# Patient Record
Sex: Male | Born: 1990 | Race: White | Hispanic: No | Marital: Single | Smoking: Never smoker
Health system: Southern US, Community
[De-identification: ages and names within clinical notes are randomized; demographics above are authoritative.]

## PROBLEM LIST (undated history)

## (undated) DIAGNOSIS — Z8619 Personal history of other infectious and parasitic diseases: Secondary | ICD-10-CM

## (undated) DIAGNOSIS — T7840XA Allergy, unspecified, initial encounter: Secondary | ICD-10-CM

## (undated) DIAGNOSIS — J45909 Unspecified asthma, uncomplicated: Secondary | ICD-10-CM

## (undated) HISTORY — DX: Allergy, unspecified, initial encounter: T78.40XA

## (undated) HISTORY — DX: Unspecified asthma, uncomplicated: J45.909

## (undated) HISTORY — DX: Personal history of other infectious and parasitic diseases: Z86.19

## (undated) HISTORY — PX: TONSILLECTOMY: SUR1361

---

## 2010-10-30 ENCOUNTER — Inpatient Hospital Stay (INDEPENDENT_AMBULATORY_CARE_PROVIDER_SITE_OTHER)
Admission: RE | Admit: 2010-10-30 | Discharge: 2010-10-30 | Disposition: A | Discharge status: Home / Self Care | Payer: Self-pay | Source: Ambulatory Visit | Attending: Emergency Medicine | Admitting: Emergency Medicine

## 2010-10-30 DIAGNOSIS — R6889 Other general symptoms and signs: Secondary | ICD-10-CM

## 2012-11-06 ENCOUNTER — Ambulatory Visit (INDEPENDENT_AMBULATORY_CARE_PROVIDER_SITE_OTHER): Payer: No Typology Code available for payment source | Admitting: Internal Medicine

## 2012-11-06 ENCOUNTER — Encounter: Payer: Self-pay | Admitting: Internal Medicine

## 2012-11-06 VITALS — BP 120/80 | HR 88 | Temp 98.6°F | Resp 20 | Ht 70.5 in | Wt 177.0 lb

## 2012-11-06 DIAGNOSIS — J45909 Unspecified asthma, uncomplicated: Secondary | ICD-10-CM

## 2012-11-06 DIAGNOSIS — Z0000 Encounter for general adult medical examination without abnormal findings: Secondary | ICD-10-CM

## 2012-11-06 NOTE — Progress Notes (Signed)
Subjective:    Patient ID: Edward Salazar, male    DOB: February 14, 1991, 22 y.o.   MRN: [id number]  HPI  22 year old patient who is seen today to establish with our practice. His chief complaint is some diminished exercise capacity. He states that he has a history of childhood asthma and believes that he may have been treated with albuterol when he was young. He states when he attempts to jog he gets short of breath after approximately 3/4 of a mile. He feels that his exercise capacity never has been very good. He is status with a another doctor recently and he states his spirometry showed some mild obstruction. He does have a history of allergic rhinitis worse in the fall in the spring. He denies any wheezing. He works as a Retail banker and is exposed to organic and toxic substances but he does wear filters when appropriate  Past medical history is otherwise unremarkable. Social history. 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 Both parents the remaining [place]. He was raised by an aunt and uncle. He was a smoker from age [age] but low-volume about one pack per week Single  Family history father is in his early 78s he choses health unknown mother unknown paternal grandparents had both coronary artery disease and strokes One brother health unknown    Review of Systems  Constitutional: Negative for fever, chills, activity change, appetite change and fatigue.  HENT: Negative for hearing loss, ear pain, congestion, rhinorrhea, sneezing, mouth sores, trouble swallowing, neck pain, neck stiffness, dental problem, voice change, sinus pressure and tinnitus.   Eyes: Negative for photophobia, pain, redness and visual disturbance.  Respiratory: Negative for apnea, cough, choking, chest tightness, shortness of breath and wheezing.   Cardiovascular: Negative for chest pain, palpitations and leg swelling.  Gastrointestinal: Negative for nausea, vomiting,  abdominal pain, diarrhea, constipation, blood in stool, abdominal distention, anal bleeding and rectal pain.  Genitourinary: Negative for dysuria, urgency, frequency, hematuria, flank pain, decreased urine volume, discharge, penile swelling, scrotal swelling, difficulty urinating, genital sores and testicular pain.  Musculoskeletal: Negative for myalgias, back pain, joint swelling, arthralgias and gait problem.  Skin: Negative for color change, rash and wound.  Neurological: Negative for dizziness, tremors, seizures, syncope, facial asymmetry, speech difficulty, weakness, light-headedness, numbness and headaches.  Hematological: Negative for adenopathy. Does not bruise/bleed easily.  Psychiatric/Behavioral: Negative for suicidal ideas, hallucinations, behavioral problems, confusion, sleep disturbance, self-injury, dysphoric mood, decreased concentration and agitation. The patient is not nervous/anxious.        Objective:   Physical Exam  Constitutional: He appears well-developed and well-nourished.  HENT:  Head: Normocephalic and atraumatic.  Right Ear: External ear normal.  Left Ear: External ear normal.  Nose: Nose normal.  Mouth/Throat: Oropharynx is clear and moist.  Eyes: Conjunctivae and EOM are normal. Pupils are equal, round, and reactive to light. No scleral icterus.  Neck: Normal range of motion. Neck supple. No JVD present. No thyromegaly present.  Cardiovascular: Regular rhythm, normal heart sounds and intact distal pulses.  Exam reveals no gallop and no friction rub.   No murmur heard. Pulmonary/Chest: Effort normal and breath sounds normal. No respiratory distress. He has no wheezes. He has no rales. He exhibits no tenderness.  O2 saturation 99  Abdominal: Soft. Bowel sounds are normal. He exhibits no distension and no mass. There is no tenderness.  Genitourinary: Prostate normal and penis normal.  Musculoskeletal: Normal range of motion. He exhibits no edema and  no  tenderness.  Lymphadenopathy:    He has no cervical adenopathy.  Neurological: He is alert. He has normal reflexes. No cranial nerve deficit. Coordination normal.  Skin: Skin is warm and dry. No rash noted.  Psychiatric: He has a normal mood and affect. His behavior is normal.          Assessment & Plan:  Preventive health exam History of exercise intolerance. Unclear whether this is exercise associated asthma (doubtful) or possibly just deconditioning. We'll attempt to obtain spirometry today. Give a trial of albuterol prior to exercise

## 2012-11-06 NOTE — Patient Instructions (Signed)
Albuterol 2 inhalations prior to exercise  Call or return to clinic prn if these symptoms worsen or fail to improve as anticipated.

## 2012-12-10 ENCOUNTER — Ambulatory Visit (INDEPENDENT_AMBULATORY_CARE_PROVIDER_SITE_OTHER): Payer: No Typology Code available for payment source | Admitting: Internal Medicine

## 2012-12-10 ENCOUNTER — Encounter: Payer: Self-pay | Admitting: Internal Medicine

## 2012-12-10 VITALS — BP 110/78 | HR 77 | Temp 98.0°F | Resp 20 | Wt 172.0 lb

## 2012-12-10 DIAGNOSIS — B078 Other viral warts: Secondary | ICD-10-CM

## 2012-12-10 DIAGNOSIS — J069 Acute upper respiratory infection, unspecified: Secondary | ICD-10-CM

## 2012-12-10 DIAGNOSIS — B079 Viral wart, unspecified: Secondary | ICD-10-CM

## 2012-12-10 NOTE — Progress Notes (Signed)
Subjective:    Patient ID: Edward Salazar, male    DOB: [birth year], 22 y.o.   MRN: [id number]  HPI  22 year old patient who presents a two-day history of sinus congestion sore throat and minimal cough. He also has mild headache. No fever. 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 He has tried albuterol with some benefit  He is also complaining of a wart just distal to his left knee and is requesting removal  Past Medical History  Diagnosis Date  . Asthma   . History of chicken pox   . Allergy     History   Social History  . Marital Status: Single    Spouse Name: N/A    Number of Children: N/A  . Years of Education: N/A   Occupational History  . Not on file.   Social History Main Topics  . Smoking status: Never Smoker   . Smokeless tobacco: Never Used  . Alcohol Use: 7.2 oz/week    12 Cans of beer per week  . Drug Use: No  . Sexual Activity: Not on file   Other Topics Concern  . Not on file   Social History Narrative  . No narrative on file    No past surgical history on file.  No family history on file.  No Known Allergies  Current Outpatient Prescriptions on File Prior to Visit  Medication Sig Dispense Refill  . Multiple Vitamin (MULTIVITAMIN) tablet Take 1 tablet by mouth daily.       No current facility-administered medications on file prior to visit.    BP 110/78  Pulse 77  Temp(Src) 98 F (36.7 C) (Oral)  Resp 20  Wt 172 lb (78.019 kg)  BMI 24.32 kg/m2  SpO2 97%       Review of Systems  Constitutional: Negative for fever, chills, appetite change and fatigue.  HENT: Positive for congestion, rhinorrhea and postnasal drip. Negative for hearing loss, ear pain, sore throat, trouble swallowing, neck stiffness, dental problem, voice change and tinnitus.   Eyes: Negative for pain, discharge and visual disturbance.  Respiratory: Positive for cough. Negative for chest tightness, wheezing and stridor.   Cardiovascular:  Negative for chest pain, palpitations and leg swelling.  Gastrointestinal: Negative for nausea, vomiting, abdominal pain, diarrhea, constipation, blood in stool and abdominal distention.  Genitourinary: Negative for urgency, hematuria, flank pain, discharge, difficulty urinating and genital sores.  Musculoskeletal: Negative for myalgias, back pain, joint swelling, arthralgias and gait problem.  Skin: Negative for rash.  Neurological: Positive for headaches. Negative for dizziness, syncope, speech difficulty, weakness and numbness.  Hematological: Negative for adenopathy. Does not bruise/bleed easily.  Psychiatric/Behavioral: Negative for behavioral problems and dysphoric mood. The patient is not nervous/anxious.        Objective:   Physical Exam  Constitutional: He is oriented to person, place, and time. He appears well-developed.  HENT:  Head: Normocephalic.  Right Ear: External ear normal.  Left Ear: External ear normal.  Mild erythema of the oropharynx  Eyes: Conjunctivae and EOM are normal.  Neck: Normal range of motion.  Cardiovascular: Normal rate and normal heart sounds.   Pulmonary/Chest: Breath sounds normal.  Abdominal: Bowel sounds are normal.  Musculoskeletal: Normal range of motion. He exhibits no edema and no tenderness.  Neurological: He is alert and oriented to person, place, and time.  Psychiatric: He has a normal mood and affect. His behavior is normal.          Assessment & Plan:  Lower URI. We'll  treat symptomatically  Verruca left knee. Procedure note:  The 5 mm verruca  involving the anterior surface of the left lower leg just distal to the knee was treated with cryotherapy without complication

## 2012-12-10 NOTE — Patient Instructions (Signed)
Acute sinusitis symptoms for less than 10 days are generally not helped by antibiotic therapy.  Use saline irrigation, warm  moist compresses and over-the-counter decongestants only as directed.  Call if there is no improvement in 5 to 7 days, or sooner if you develop increasing pain, fever, or any new symptoms. 

## 2013-10-18 ENCOUNTER — Encounter: Payer: Self-pay | Admitting: Internal Medicine

## 2013-10-18 ENCOUNTER — Ambulatory Visit (INDEPENDENT_AMBULATORY_CARE_PROVIDER_SITE_OTHER): Payer: No Typology Code available for payment source | Admitting: Internal Medicine

## 2013-10-18 VITALS — BP 138/90 | HR 83 | Temp 97.5°F | Resp 20 | Ht 70.5 in | Wt 175.0 lb

## 2013-10-18 DIAGNOSIS — D239 Other benign neoplasm of skin, unspecified: Secondary | ICD-10-CM

## 2013-10-18 DIAGNOSIS — D229 Melanocytic nevi, unspecified: Secondary | ICD-10-CM

## 2013-10-18 NOTE — Patient Instructions (Signed)
Call or return to clinic prn if these symptoms worsen or fail to improve as anticipated.

## 2013-10-18 NOTE — Progress Notes (Signed)
   Subjective:    Patient ID: Edward Salazar, male    DOB: [birth year], 23 y.o.   MRN: [id number]  HPI  23 year old patient who is in today concerned about a skin lesion involving his right facial area.  He also has some lesions on his posterior back area.  Past Medical History  Diagnosis Date  . Asthma   . History of chicken pox   . Allergy     History   Social History  . Marital Status: Single    Spouse Name: N/A    Number of Children: N/A  . Years of Education: N/A   Occupational History  . Not on file.   Social History Main Topics  . Smoking status: Never Smoker   . Smokeless tobacco: Never Used  . Alcohol Use: 7.2 oz/week    12 Cans of beer per week  . Drug Use: No  . Sexual Activity: Not on file   Other Topics Concern  . Not on file   Social History Narrative  . No narrative on file    History reviewed. No pertinent past surgical history.  No family history on file.  No Known Allergies  Current Outpatient Prescriptions on File Prior to Visit  Medication Sig Dispense Refill  . Multiple Vitamin (MULTIVITAMIN) tablet Take 1 tablet by mouth daily.       No current facility-administered medications on file prior to visit.    BP 138/90  Pulse 83  Temp(Src) 97.5 F (36.4 C) (Oral)  Resp 20  Ht 5' 10.5" (1.791 m)  Wt 175 lb (79.379 kg)  BMI 24.75 kg/m2  SpO2 98%      Review of Systems  Skin: Positive for rash.       Objective:   Physical Exam  Constitutional: He appears well-developed and well-nourished. No distress.  Repeat blood pressure improved to 130/ 72  Skin:   2 mm pigmented papular lesion, right facial area, consistent with a benign nevus  Scattered benign nevi of the back          Assessment & Plan:   Multiple benign nevi.  Patient reassured

## 2014-07-05 ENCOUNTER — Telehealth: Payer: Self-pay | Admitting: Internal Medicine

## 2014-07-05 NOTE — Telephone Encounter (Signed)
Pt's girlfriend was told she had pin worm infection. Would like to know if he should get rx as well. [facility] [address]

## 2014-07-05 NOTE — Telephone Encounter (Signed)
Spoke to pt, told him Dr. Raliegh Ip suggest the patient have his girlfriend talked to her physician for recommendations. Therapy may be indicated depending on the type of infection. Pt verbalized understanding and will get back to me.

## 2014-07-05 NOTE — Telephone Encounter (Signed)
Please see message and advise 

## 2014-07-05 NOTE — Telephone Encounter (Signed)
Suggest the patient have his girlfriend talked to her physician for recommendations.  Therapy may be indicated depending on the type of infection

## 2014-07-13 ENCOUNTER — Telehealth: Payer: Self-pay | Admitting: Internal Medicine

## 2014-07-13 MED ORDER — ALBENDAZOLE 200 MG PO TABS: ORAL_TABLET | ORAL | Status: DC

## 2014-07-13 NOTE — Telephone Encounter (Signed)
Left message on voicemail to call office.  

## 2014-07-13 NOTE — Telephone Encounter (Signed)
Please see message and advise if need to treat pt also for pin worms?

## 2014-07-13 NOTE — Telephone Encounter (Signed)
400 mg  #2  One now and repeat in 4 weeks

## 2014-07-13 NOTE — Telephone Encounter (Signed)
Spoke to pt, told him will send Rx for Albendazole 200 mg tablets, take 2 now and repeat in 4 weeks. Pt verbalized understanding. Rx sent to pharmacy.

## 2014-07-13 NOTE — Telephone Encounter (Signed)
Pt girlfriend is getting 2 part treatment of albensazole. This is follow up from telephone note from 07-05-14

## 2015-04-18 ENCOUNTER — Ambulatory Visit (INDEPENDENT_AMBULATORY_CARE_PROVIDER_SITE_OTHER): Payer: 59 | Admitting: Internal Medicine

## 2015-04-18 ENCOUNTER — Encounter: Payer: Self-pay | Admitting: Internal Medicine

## 2015-04-18 VITALS — BP 124/80 | HR 84 | Temp 98.4°F | Resp 20 | Ht 70.5 in | Wt 174.0 lb

## 2015-04-18 DIAGNOSIS — Z23 Encounter for immunization: Secondary | ICD-10-CM

## 2015-04-18 DIAGNOSIS — B079 Viral wart, unspecified: Secondary | ICD-10-CM

## 2015-04-18 DIAGNOSIS — B078 Other viral warts: Secondary | ICD-10-CM

## 2015-04-18 NOTE — Progress Notes (Signed)
   Subjective:    Patient ID: Edward Salazar, male    DOB: [birth year], 25 y.o.   MRN: [id number]  HPI  25 year old patient who complains of a recurrent warts involving his left knee area.  He states this was treated by cryotherapy approximately 2 years ago and more recently has recurred Otherwise, doing well  Social history.  Going to school part-time in Engineer, production.  Hopeful to be excepted to [employer] for an engineering degree.  This spring   Past Medical History  Diagnosis Date  . Asthma   . History of chicken pox   . Allergy      Review of Systems  Constitutional: Negative for fever, chills, appetite change and fatigue.  HENT: Negative for congestion, dental problem, ear pain, hearing loss, sore throat, tinnitus, trouble swallowing and voice change.   Eyes: Negative for pain, discharge and visual disturbance.  Respiratory: Negative for cough, chest tightness, wheezing and stridor.   Cardiovascular: Negative for chest pain, palpitations and leg swelling.  Gastrointestinal: Negative for nausea, vomiting, abdominal pain, diarrhea, constipation, blood in stool and abdominal distention.  Genitourinary: Negative for urgency, hematuria, flank pain, discharge, difficulty urinating and genital sores.  Musculoskeletal: Negative for myalgias, back pain, joint swelling, arthralgias, gait problem and neck stiffness.  Skin: Positive for rash.  Neurological: Negative for dizziness, syncope, speech difficulty, weakness, numbness and headaches.  Hematological: Negative for adenopathy. Does not bruise/bleed easily.  Psychiatric/Behavioral: Negative for behavioral problems and dysphoric mood. The patient is not nervous/anxious.        Objective:   Physical Exam  Constitutional: He appears well-developed and well-nourished.  Skin:  5 x 7 mm verruca left knee area          Assessment & Plan:   Verruca, left knee.  Treated with cryotherapy

## 2015-04-18 NOTE — Progress Notes (Signed)
Pre visit review using our clinic review tool, if applicable. No additional management support is needed unless otherwise documented below in the visit note. 

## 2015-04-18 NOTE — Patient Instructions (Signed)
Cryosurgery for Skin Conditions Cryosurgery, also called cryotherapy, is the use of extreme cold to freeze and remove abnormal or diseased tissue. Growths on the skin such as warts, precancerous skin lesions (actinic keratoses), and some kinds of skin cancer may be removed with cryosurgery. LET [facility] CARE PROVIDER KNOW ABOUT:  Any allergies you have.  All medicines you are taking, including vitamins, herbs, eye drops, creams, and over-the-counter medicines.  Previous problems you or members of your family have had with the use of anesthetics.  Any blood disorders you have.  Previous surgeries you have had.  Medical conditions you have. RISKS AND COMPLICATIONS Generally, this is a safe procedure. However, as with any procedure, complications can occur. Possible complications include:  Scars.  Changes in skin color (lighter or darker than normal skin tone).  Swelling.  Nerve damage and loss of feeling (rare). BEFORE THE PROCEDURE No preparation is necessary. PROCEDURE  Cryosurgery usually takes a few minutes and can be done in your health care provider's office. There are different methods for performing cryosurgery.   Your health care provider may use a device (probe) that has liquid nitrogen flowing through it. The liquid nitrogen cools the probe. The probe is then applied to the growth until it is frozen and destroyed.  Your health care provider may spray liquid nitrogen directly on the growth. AFTER THE PROCEDURE Shortly after the procedure, the treated area will become red and swollen. This is normal. You will be advised to keep the treated area clean and covered with a bandage until healed. You will be able to go home shortly after the procedure. You may need the treatment again if the growth comes back.   This information is not intended to replace advice given to you by your health care provider. Make sure you discuss any questions you have with your health care  provider.   Document Released: 03/29/2000 Document Revised: 12/02/2012 Document Reviewed: 10/30/2012 Elsevier Interactive Patient Education 2016 [place] Warts Warts are small growths on the skin. They are common and can occur on various areas of the body. A person may have one wart or multiple warts. Most warts are not painful, and they usually do not cause problems. However, warts can cause pain if they are large or occur in an area of the body where pressure will be applied to them, such as the bottom of the foot. In many cases, warts do not require treatment. They usually go away on their own over a period of many months to a couple years. Various treatments may be done for warts that cause problems or do not go away. Sometimes, warts go away and then come back again. CAUSES Warts are caused by a type of virus that is called human papillomavirus (HPV). This virus can spread from person to person through direct contact. Warts can also spread to other areas of the body when a person scratches a wart and then scratches another area of his or her body.  RISK FACTORS Warts are more likely to develop in:  People who are 17-73 years of age.  People who have a weakened body defense system (immune system). SYMPTOMS A wart may be round or oval or have an irregular shape. Most warts have a rough surface. Warts may range in color from skin color to light yellow, brown, or gray. They are generally less than  inch (1.3 cm) in size. Most warts are painless, but some can be painful when pressure is applied to them.  DIAGNOSIS A wart can usually be diagnosed from its appearance. In some cases, a tissue sample may be removed (biopsy) to be looked at under a microscope. TREATMENT In many cases, warts do not need treatment. If treatment is needed, options may include:  Applying medicated solutions, creams, or patches to the wart. These may be over-the-counter or prescription medicines that make the skin  soft so that layers will gradually shed away. In many cases, the medicine is applied one or two times per day and covered with a bandage.  Putting duct tape over the top of the wart (occlusion). You will leave the tape in place for as long as told by your health care provider, then you will replace it with a new strip of tape. This is done until the wart goes away.  Freezing the wart with liquid nitrogen (cryotherapy).  Burning the wart with:  Laser treatment.  An electrified probe (electrocautery).  Injection of a medicine (Candida antigen) into the wart to help the body's immune system to fight off the wart.  Surgery to remove the wart. HOME CARE INSTRUCTIONS  Apply over-the-counter and prescription medicines only as told by your health care provider.  Do not apply over-the-counter wart medicines to your face or genitals before you ask your health care provider if it is okay to do so.  Do not scratch or pick at a wart.  Wash your hands after you touch a wart.  Avoid shaving hair that is over a wart.  Keep all follow-up visits as told by your health care provider. This is important. SEEK MEDICAL CARE IF:  Your warts do not improve after treatment.  You have redness, swelling, or pain at the site of a wart.  You have bleeding from a wart that does not stop with light pressure.  You have diabetes and you develop a wart.   This information is not intended to replace advice given to you by your health care provider. Make sure you discuss any questions you have with your health care provider.   Document Released: 01/09/2005 Document Revised: 12/21/2014 Document Reviewed: 06/27/2014 Elsevier Interactive Patient Education [employer].

## 2015-06-21 ENCOUNTER — Encounter: Payer: Self-pay | Admitting: Internal Medicine

## 2015-06-21 ENCOUNTER — Ambulatory Visit (INDEPENDENT_AMBULATORY_CARE_PROVIDER_SITE_OTHER): Payer: 59 | Admitting: Internal Medicine

## 2015-06-21 VITALS — BP 120/70 | HR 72 | Temp 98.6°F | Resp 18 | Ht 70.5 in | Wt 169.0 lb

## 2015-06-21 DIAGNOSIS — B079 Viral wart, unspecified: Secondary | ICD-10-CM | POA: Diagnosis not present

## 2015-06-21 NOTE — Progress Notes (Signed)
Pre visit review using our clinic review tool, if applicable. No additional management support is needed unless otherwise documented below in the visit note. 

## 2015-06-21 NOTE — Patient Instructions (Signed)
Call or return to clinic prn if these symptoms worsen or fail to improve as anticipated. Cryotherapy Cryotherapy is when you put ice on your injury. Ice helps lessen pain and puffiness (swelling) after an injury. Ice works the best when you start using it in the first 24 to 48 hours after an injury. HOME CARE  Put a dry or damp towel between the ice pack and your skin.  You may press gently on the ice pack.  Leave the ice on for no more than 10 to 20 minutes at a time.  Check your skin after 5 minutes to make sure your skin is okay.  Rest at least 20 minutes between ice pack uses.  Stop using ice when your skin loses feeling (numbness).  Do not use ice on someone who cannot tell you when it hurts. This includes small children and people with memory problems (dementia). GET HELP RIGHT AWAY IF:  You have white spots on your skin.  Your skin turns blue or pale.  Your skin feels waxy or hard.  Your puffiness gets worse. MAKE SURE YOU:   Understand these instructions.  Will watch your condition.  Will get help right away if you are not doing well or get worse.   This information is not intended to replace advice given to you by your health care provider. Make sure you discuss any questions you have with your health care provider.   Document Released: 09/18/2007 Document Revised: 06/24/2011 Document Reviewed: 11/22/2010 Elsevier Interactive Patient Education [employer].

## 2015-06-21 NOTE — Progress Notes (Signed)
   Subjective:    Patient ID: Edward Salazar, male    DOB: [birth year], 25 y.o.   MRN: [id number]  HPI  25 year old patient who is seen today for cryosurgery to remove a wart.  He has had a wart involving the anterior left lower leg just distal to the patella.  This has been frozen past, but was much larger at that time it has reoccurred, but is much smaller.  He also has 2 smaller warts involving the right hand  Review of Systems  Skin: Positive for wound.       Objective:   Physical Exam  Skin:  The largest wart was involving his anterior left lower leg and was about 5 mm in diameter.  He had 2 smaller warts about 2 mm involving the right hand          Assessment & Plan:   Warts.  Treated with cryotherapy Patient tolerated procedure well Local wound care discussed

## 2015-07-31 ENCOUNTER — Telehealth: Payer: Self-pay | Admitting: Internal Medicine

## 2015-07-31 DIAGNOSIS — Z1159 Encounter for screening for other viral diseases: Secondary | ICD-10-CM

## 2015-07-31 NOTE — Telephone Encounter (Signed)
Okay to order MMR Titer?

## 2015-07-31 NOTE — Telephone Encounter (Signed)
Edward Salazar, okay to schedule pt for lab appt. I put order in EPIC.

## 2015-07-31 NOTE — Telephone Encounter (Signed)
Pt is going to [employer] in the fall and his immunization record was lost when he moved here from [place]. Pt needs mmr done 2 times, so would like an order for a mmr titer, so he could possibly only get the vaccination once. Ok to order mmr titer?

## 2015-07-31 NOTE — Telephone Encounter (Signed)
Left message to call back and schedule.

## 2015-07-31 NOTE — Telephone Encounter (Signed)
ok 

## 2015-08-02 ENCOUNTER — Other Ambulatory Visit (INDEPENDENT_AMBULATORY_CARE_PROVIDER_SITE_OTHER): Payer: 59

## 2015-08-02 ENCOUNTER — Telehealth: Payer: Self-pay | Admitting: Internal Medicine

## 2015-08-02 DIAGNOSIS — Z1159 Encounter for screening for other viral diseases: Secondary | ICD-10-CM | POA: Diagnosis not present

## 2015-08-02 NOTE — Telephone Encounter (Signed)
Patient is getting labs to show he has had his immunizations for MMR for college.  He has an imunization card without his name on it because he was adopted to the [place] from [place].  He wants to know if the lab work shows he has had the MMR immunizations if he can have Dr Burnice Logan look at his card to confirm that those immunizations were done and get something with his name on it.  He wants something sufficient to give to [facility]. He has his adoption records as well.  I have placed the copies of what the patient has in Dr. Truddie Hidden folder.

## 2015-08-03 LAB — MEASLES/MUMPS/RUBELLA IMMUNITY
Mumps IgG: 115 AU/mL — ABNORMAL HIGH (ref <9.00)
RUBEOLA IGG: 11.3 [AU]/ml (ref <25.00)
Rubella: 4.28 Index — ABNORMAL HIGH (ref <0.90)

## 2015-08-04 NOTE — Telephone Encounter (Signed)
Pt hs been scheduled

## 2015-08-07 NOTE — Telephone Encounter (Signed)
See result note. Paperwork has been shredded. Pt does not need filled out.

## 2015-08-22 ENCOUNTER — Telehealth: Payer: Self-pay | Admitting: Internal Medicine

## 2015-08-22 NOTE — Telephone Encounter (Signed)
FYI:  Pt coming 08/23/15 to see Dr. Raliegh Ip

## 2015-08-22 NOTE — Telephone Encounter (Signed)
[facility]  Patient Name: Edward Salazar  DOB: 12/13/1990    Initial Comment Caller States pain in lower abdominal, not eating, irregular bowel movements kind of yellowish in color, been going on since last thursday, last 7 months this has been going on and off   Nurse Assessment  Nurse: Wynetta Emery, RN, Baker Janus Date/Time Eilene Ghazi Time): 08/22/2015 9:52:23 AM  Confirm and document reason for call. If symptomatic, describe symptoms. You must click the next button to save text entered. ---[name] is having lower abd pain with stools yellow in color onset Thursday  Has the patient traveled out of the country within the last 30 days? ---No  Does the patient have any new or worsening symptoms? ---Yes  Will a triage be completed? ---Yes  Related visit to physician within the last 2 weeks? ---No  Does the PT have any chronic conditions? (i.e. diabetes, asthma, etc.) ---No  Is this a behavioral health or substance abuse call? ---No     Guidelines    Guideline Title Affirmed Question Affirmed Notes  Abdominal Pain - Male [1] MODERATE pain (e.g., interferes with normal activities) AND [2] pain comes and goes (cramps) AND [3] present > 24 hours (Exception: pain with Vomiting or Diarrhea - see that Guideline)    Final Disposition User   See Physician within 24 Hours Wynetta Emery, RN, Baker Janus    Comments  NOTE: appt given for Mikeal Hawthorne 08/23/2015 at 1015am c/o lower abd pain irreg bowel patterns - no appts avail today to fit Jaysun's schedule   Referrals  REFERRED TO PCP OFFICE   Disagree/Comply: Comply

## 2015-08-23 ENCOUNTER — Ambulatory Visit (INDEPENDENT_AMBULATORY_CARE_PROVIDER_SITE_OTHER): Payer: 59 | Admitting: Internal Medicine

## 2015-08-23 ENCOUNTER — Encounter: Payer: Self-pay | Admitting: Internal Medicine

## 2015-08-23 VITALS — BP 120/80 | HR 71 | Temp 98.2°F | Resp 20 | Ht 70.5 in | Wt 169.0 lb

## 2015-08-23 DIAGNOSIS — R1013 Epigastric pain: Secondary | ICD-10-CM | POA: Diagnosis not present

## 2015-08-23 NOTE — Progress Notes (Signed)
Pre visit review using our clinic review tool, if applicable. No additional management support is needed unless otherwise documented below in the visit note. 

## 2015-08-23 NOTE — Progress Notes (Signed)
   Subjective:    Patient ID: Edward Salazar, male    DOB: 1990-09-28, 25 y.o.   MRN: [id number]  HPI  25 year old patient who presents with a 6 month history of some GI complaints.  He complains of crampy abdominal pain and also some intermittent diarrhea. Clearly, dairy products, such as milk shakes are not well tolerated with crampy abdominal pain and diarrhea.  He also feels that stress at times may be playing a role. He frequently has early morning bowel movements that are associated with urgency and sometimes loose. No nausea, vomiting or change in his weight. He has been trying Pepto-Bismol and probiotics.  At times he resorts to crackers and applesauce and avoids a more balanced diet.  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  HENT: Negative for congestion, dental problem, ear pain, hearing loss, sore throat, tinnitus, trouble swallowing and voice change.   Eyes: Negative for pain, discharge and visual disturbance.  Respiratory: Negative for cough, chest tightness, wheezing and stridor.   Cardiovascular: Negative for chest pain, palpitations and leg swelling.  Gastrointestinal: Positive for abdominal pain and diarrhea. Negative for nausea, vomiting, constipation, blood in stool and abdominal distention.  Genitourinary: Negative for urgency, hematuria, flank pain, discharge, difficulty urinating and genital sores.  Musculoskeletal: Negative for myalgias, back pain, joint swelling, arthralgias, gait problem and neck stiffness.  Skin: Negative for rash.  Neurological: Negative for dizziness, syncope, speech difficulty, weakness, numbness and headaches.  Hematological: Negative for adenopathy. Does not bruise/bleed easily.  Psychiatric/Behavioral: Negative for behavioral problems and dysphoric mood. The patient is not  nervous/anxious.        Objective:   Physical Exam  Constitutional: He is oriented to person, place, and time. He appears well-developed.  HENT:  Head: Normocephalic.  Right Ear: External ear normal.  Left Ear: External ear normal.  Eyes: Conjunctivae and EOM are normal.  Neck: Normal range of motion.  Cardiovascular: Normal rate and normal heart sounds.   Pulmonary/Chest: Breath sounds normal.  Abdominal: Soft. Bowel sounds are normal. He exhibits no distension. There is no tenderness. There is no rebound and no guarding.  Musculoskeletal: Normal range of motion. He exhibits no edema or tenderness.  Neurological: He is alert and oriented to person, place, and time.  Psychiatric: He has a normal mood and affect. His behavior is normal.          Assessment & Plan:   Intermittent abdominal pain and diarrhea Probable lactose intolerance Possible component of IBS  Will treat with a high-fiber diet and avoid lactose.  Information dispensed We'll consider further evaluation if symptoms fail to improve

## 2015-08-23 NOTE — Patient Instructions (Signed)
Diet for Lactose Intolerance, Adult  Lactose intolerance is when the body is not able to digest lactose, a natural sugar found in milk and milk products. If you are lactose intolerant, you should avoid consuming food and drinks with lactose.   WHAT DO I NEED TO KNOW ABOUT THIS DIET?  · Avoid consuming foods and beverages with lactose.  · Look for the words "lactose-free" or "lactose-reduced" on food labels. You can have lactose-free foods and may be able to have small amounts of lactose-reduced foods.  · Make sure you get enough nutrients in your diet. People on this diet sometimes have trouble getting enough calcium, riboflavin, and vitamin D. Take supplements if directed by your health care provider. Talk to your health care provider about supplements if you are not taking any.  WHICH FOODS HAVE LACTOSE?  Lactose is found in milk and milk products, such as:   · Yogurt.    · Cheese.  · Butter.    · Margarine.  · Sour cream.    · Creamer.    · Whipped toppings and nondairy creamers.  · Ice cream and other milk-based desserts.  Lactose is also found in foods made with milk or milk ingredients. To find out whether a food is made with milk or a milk ingredient, look at the ingredients list. Avoid foods with the statement "May contain milk" and foods that contain:   · Butter.    · Cream.   · Milk.  · Milk solids.  · Milk powder.     · Whey.  · Curd.   · Caseinate.  · Lactose.  WHAT ARE SOME ALTERNATIVES TO MILK AND FOODS MADE WITH MILK PRODUCTS?  · Lactose-free products, such as lactose-free milk.  · Almond or rice milk.  · Soy products, such as soy yogurt, soy cheese, soy ice cream, soy-based sour cream, and soy-based infant formula.  · Nondairy products, such as nondairy creamers and nondairy whipped topping. Note that nondairy products sometimes contain lactose, so it is important to check the ingredients list.  CAN I HAVE ANY FOODS WITH LACTOSE?  Some people with lactose intolerance can safely eat foods that have a  little lactose. Foods with a little lactose have less than 1 g of lactose per serving. Examples of foods with a little lactose are:   · Aged cheese (such as Swiss, cheddar, or Parmesan cheese). One serving is about 1-2 oz.  · Cream cheese. One serving is about 2 Tbsp.  · Ricotta cheese. One serving is about ½ cup.  If you decide to try a food that has lactose:   · Eat only one food with lactose in it at a time.  · Eat only a small amount of the food.  · Stop eating the food if your symptoms return.  Some dairy products that are more likely than others to be tolerated include:  · Cheese, especially if it is aged.  · Cultured dairy products, such as yogurt, buttermilk, cottage cheese, and kefir. The healthy bacteria in these products help digest lactose.  · Lactose-hydrolyzed milk. This product contains 40-90% less lactose than milk.  AM I GETTING ENOUGH CALCIUM?  Calcium is found in many foods with lactose and is important for bone health. The amount of calcium you need depends on your age:   · Adults younger than 50 years need 1000 mg of calcium a day.  · Adults older than 50 years need 1200 mg of calcium a day.  Make sure you get enough calcium by taking a calcium supplement   1 cup (300-400 mg).  Canned salmon with edible bones, 3 oz (180 mg).  Collard greens, cooked,  cup (125 mg).  Edamame, cooked,  cup (125 mg).  Kale, frozen or cooked,  cup (90 mg).  Orange juice with calcium added, 1 cup (300-350 mg).  Sardines with edible bones, 3 oz (325 mg).  Spinach, cooked,  cup (145 mg).  Tofu set with calcium sulfate,  cup (250 mg).   This information is not intended to replace advice given to you by your health care  provider. Make sure you discuss any questions you have with your health care provider.   Document Released: 10/27/2013 Document Reviewed: 10/27/2013 Elsevier Interactive Patient Education 2016 Elsevier Inc. Lactose Intolerance, Adult Lactose is the natural sugar found in milk and milk products, such as cheese and yogurt. Lactose is digested by lactase, an enzyme in your small intestine. Some people do not produce enough lactase to digest lactose. This is called lactose intolerance. Lactose intolerance is different from milk allergy, which is a more serious reaction to the protein in milk.  CAUSES Causes of lactose intolerance may include:   Normal aging. The ability to produce lactase may decline with age, causing lactose intolerance over time.  Being born without the ability to make lactase.   Digestive diseases such as gastroenteritis or inflammatory bowel disease.  Surgery or injuries to your small intestine.  Infection in your intestines.  Certain antibiotic medicines and cancer treatments. SIGNS AND SYMPTOMS  Lactose intolerance can cause uncomfortable symptoms. These are likely to occur within 30 minutes to 2 hours after eating or drinking foods containing lactose. Symptoms of lactose intolerance may include:  Nausea.  Diarrhea.  Abdominal cramps or pain.  Bloating.   Gas.  DIAGNOSIS  There are several tests your health care provider can do to diagnose lactose intolerance. These tests include a hydrogen breath test and stool acidity test.  TREATMENT  No treatment can improve your body's ability to produce lactase. However, your symptoms can be controlled by limiting or avoiding milk products and other sources of lactose and adjusting your diet. Lactose-free milk is often tolerated. Lactose digestion may also be improved by adding lactase drops to regular milk or by taking lactase tablets when dairy products are consumed. Tolerance to lactose is individual. Some people  may be able to eat or drink small amounts of products with lactose, while other may need to avoid lactose entirely. Talk to your health care provider about what is best for you.  HOME CARE INSTRUCTIONS  Limit or avoidfoods, beverages, and medicines containing lactose as directed by your health care provider.   Read food and medicine labels carefully to avoid products containing lactose, milk solids, casein, or whey.  If you eliminate dairy products, replace the protein, calcium, vitamin D, and other nutrients they contain through other foods. A registered dietitian or your health care provider can help you adjust your diet.  Choose a milk substitute that is fortified with calcium and vitamin D. Be aware that soy milk contains high quality protein, while milks made from nuts or grains contain very little protein.  Use lactase drops or tablets if directed by your health care provider. SEEK MEDICAL CARE IF: You have no relief from your symptoms after eliminating milk products and other sources of lactose.    This information is not intended to replace advice given to you by your health care provider. Make sure you discuss any questions you have with your health care provider.  Document Released: 04/01/2005 Document Revised: 04/22/2014 Document Reviewed: 07/02/2013 Elsevier Interactive Patient Education 2016 Elsevier Inc. Irritable Bowel Syndrome, Adult Irritable bowel syndrome (IBS) is not one specific disease. It is a group of symptoms that affects the organs responsible for digestion (gastrointestinal or GI tract).  To regulate how your GI tract works, your body sends signals back and forth between your intestines and your brain. If you have IBS, there may be a problem with these signals. As a result, your GI tract does not function normally. Your intestines may become more sensitive and overreact to certain things. This is especially true when you eat certain foods or when you are under  stress.  There are four types of IBS. These may be determined based on the consistency of your stool:   IBS with diarrhea.   IBS with constipation.   Mixed IBS.   Unsubtyped IBS.  It is important to know which type of IBS you have. Some treatments are more likely to be helpful for certain types of IBS.  CAUSES  The exact cause of IBS is not known. RISK FACTORS You may have a higher risk of IBS if:  You are a woman.  You are younger than 27 years old.  You have a family history of IBS.  You have mental health problems.  You have had bacterial infection of your GI tract. SIGNS AND SYMPTOMS  Symptoms of IBS vary from person to person. The main symptom is abdominal pain or discomfort. Additional symptoms usually include one or more of the following:   Diarrhea, constipation, or both.   Abdominal swelling or bloating.   Feeling full or sick after eating a small or regular-size meal.   Frequent gas.   Mucus in the stool.   A feeling of having more stool left after a bowel movement.  Symptoms tend to come and go. They may be associated with stress, psychiatric conditions, or nothing at all.  DIAGNOSIS  There is no specific test to diagnose IBS. Your health care provider will make a diagnosis based on a physical exam, medical history, and your symptoms. You may have other tests to rule out other conditions that may be causing your symptoms. These may include:   Blood tests.   X-rays.   CT scan.  Endoscopy and colonoscopy. This is a test in which your GI tract is viewed with a long, thin, flexible tube. TREATMENT There is no cure for IBS, but treatment can help relieve symptoms. IBS treatment often includes:   Changes to your diet, such as:  Eating more fiber.  Avoiding foods that cause symptoms.  Drinking more water.  Eating regular, medium-sized portioned meals.  Medicines. These may include:  Fiber supplements if you have  constipation.  Medicine to control diarrhea (antidiarrheal medicines).  Medicine to help control muscle spasms in your GI tract (antispasmodic medicines).  Medicines to help with any mental health issues, such as antidepressants or tranquilizers.  Therapy.  Talk therapy may help with anxiety, depression, or other mental health issues that can make IBS symptoms worse.  Stress reduction.  Managing your stress can help keep symptoms under control. HOME CARE INSTRUCTIONS   Take medicines only as directed by your health care provider.  Eat a healthy diet.  Avoid foods and drinks with added sugar.  Include more whole grains, fruits, and vegetables gradually into your diet. This may be especially helpful if you have IBS with constipation.  Avoid any foods and drinks that make your symptoms  worse. These may include dairy products and caffeinated or carbonated drinks.  Do not eat large meals.  Drink enough fluid to keep your urine clear or pale yellow.  Exercise regularly. Ask your health care provider for recommendations of good activities for you.  Keep all follow-up visits as directed by your health care provider. This is important. SEEK MEDICAL CARE IF:   You have constant pain.  You have trouble or pain with swallowing.  You have worsening diarrhea. SEEK IMMEDIATE MEDICAL CARE IF:   You have severe and worsening abdominal pain.   You have diarrhea and:   You have a rash, stiff neck, or severe headache.   You are irritable, sleepy, or difficult to awaken.   You are weak, dizzy, or extremely thirsty.   You have bright red blood in your stool or you have black tarry stools.   You have unusual abdominal swelling that is painful.   You vomit continuously.   You vomit blood (hematemesis).   You have both abdominal pain and a fever.    This information is not intended to replace advice given to you by your health care provider. Make sure you discuss any  questions you have with your health care provider.   Document Released: 04/01/2005 Document Revised: 04/22/2014 Document Reviewed: 12/17/2013 Elsevier Interactive Patient Education [employer].

## 2015-09-07 ENCOUNTER — Telehealth: Payer: Self-pay | Admitting: Internal Medicine

## 2015-09-07 DIAGNOSIS — R1013 Epigastric pain: Secondary | ICD-10-CM

## 2015-09-07 NOTE — Telephone Encounter (Signed)
Pt still having abd pain and is asking what else can be done. Not eating any fatty food. Would like a call back concerning what to do next. Having some fatigue .

## 2015-09-07 NOTE — Telephone Encounter (Signed)
Please see message and advise 

## 2015-09-08 ENCOUNTER — Encounter: Payer: Self-pay | Admitting: Gastroenterology

## 2015-09-08 NOTE — Telephone Encounter (Signed)
Spoke to pt, told him Dr.K wants him to see GI, order put in and someone will contact you to schedule an appt. Pt verbalized understanding.

## 2015-09-08 NOTE — Telephone Encounter (Signed)
Please schedule GI consultation

## 2015-09-29 ENCOUNTER — Other Ambulatory Visit (INDEPENDENT_AMBULATORY_CARE_PROVIDER_SITE_OTHER): Payer: 59

## 2015-09-29 ENCOUNTER — Ambulatory Visit (INDEPENDENT_AMBULATORY_CARE_PROVIDER_SITE_OTHER): Payer: 59 | Admitting: Gastroenterology

## 2015-09-29 ENCOUNTER — Encounter: Payer: Self-pay | Admitting: Gastroenterology

## 2015-09-29 VITALS — BP 126/72 | HR 81 | Ht 71.0 in | Wt 160.0 lb

## 2015-09-29 DIAGNOSIS — R195 Other fecal abnormalities: Secondary | ICD-10-CM

## 2015-09-29 DIAGNOSIS — R1084 Generalized abdominal pain: Secondary | ICD-10-CM

## 2015-09-29 LAB — CBC WITH DIFFERENTIAL/PLATELET
BASOS ABS: 0 10*3/uL (ref 0.0–0.1)
BASOS PCT: 1.1 % (ref 0.0–3.0)
EOS ABS: 0.1 10*3/uL (ref 0.0–0.7)
Eosinophils Relative: 3.1 % (ref 0.0–5.0)
HCT: 43.9 % (ref 39.0–52.0)
Hemoglobin: 15.2 g/dL (ref 13.0–17.0)
Lymphocytes Relative: 37.8 % (ref 12.0–46.0)
Lymphs Abs: 1.5 10*3/uL (ref 0.7–4.0)
MCHC: 34.6 g/dL (ref 30.0–36.0)
MCV: 86.5 fl (ref 78.0–100.0)
MONO ABS: 0.4 10*3/uL (ref 0.1–1.0)
Monocytes Relative: 9.5 % (ref 3.0–12.0)
NEUTROS ABS: 2 10*3/uL (ref 1.4–7.7)
Neutrophils Relative %: 48.5 % (ref 43.0–77.0)
Platelets: 241 10*3/uL (ref 150.0–400.0)
RBC: 5.08 Mil/uL (ref 4.22–5.81)
RDW: 13.1 % (ref 11.5–15.5)
WBC: 4 10*3/uL (ref 4.0–10.5)

## 2015-09-29 LAB — COMPREHENSIVE METABOLIC PANEL
ALBUMIN: 5 g/dL (ref 3.5–5.2)
ALT: 15 U/L (ref 0–53)
AST: 15 U/L (ref 0–37)
Alkaline Phosphatase: 65 U/L (ref 39–117)
BUN: 12 mg/dL (ref 6–23)
CALCIUM: 9.9 mg/dL (ref 8.4–10.5)
CHLORIDE: 105 meq/L (ref 96–112)
CO2: 28 mEq/L (ref 19–32)
CREATININE: 0.99 mg/dL (ref 0.40–1.50)
GFR: 97.33 mL/min (ref >60.00)
Glucose, Bld: 101 mg/dL — ABNORMAL HIGH (ref 70–99)
POTASSIUM: 4.1 meq/L (ref 3.5–5.1)
Sodium: 140 mEq/L (ref 135–145)
Total Bilirubin: 0.6 mg/dL (ref 0.2–1.2)
Total Protein: 7.7 g/dL (ref 6.0–8.3)

## 2015-09-29 LAB — HIGH SENSITIVITY CRP: CRP HIGH SENSITIVITY: 0.06 mg/L (ref 0.000–5.000)

## 2015-09-29 LAB — TSH: TSH: 1.36 u[IU]/mL (ref 0.35–4.50)

## 2015-09-29 LAB — IGA: IgA: 135 mg/dL (ref 68–378)

## 2015-09-29 MED ORDER — PANTOPRAZOLE SODIUM 40 MG PO TBEC: 40.0000 mg | DELAYED_RELEASE_TABLET | Freq: Every day | ORAL | Status: AC

## 2015-09-29 MED ORDER — DICYCLOMINE HCL 10 MG PO CAPS: 10.0000 mg | ORAL_CAPSULE | Freq: Two times a day (BID) | ORAL | Status: DC

## 2015-09-29 NOTE — Patient Instructions (Signed)
Your physician has requested that you go to the basement for lab work before leaving today  We have sent the following medications to your pharmacy for you to pick up at your convenience:  Pantoprazole, Dicyclomine  Call back in 3-4 weeks with an update.  Ask for [facility].

## 2015-10-02 ENCOUNTER — Encounter: Payer: Self-pay | Admitting: Gastroenterology

## 2015-10-02 DIAGNOSIS — R1084 Generalized abdominal pain: Secondary | ICD-10-CM | POA: Insufficient documentation

## 2015-10-02 DIAGNOSIS — R195 Other fecal abnormalities: Secondary | ICD-10-CM | POA: Insufficient documentation

## 2015-10-02 LAB — TISSUE TRANSGLUTAMINASE, IGA: TISSUE TRANSGLUTAMINASE AB, IGA: 1 U/mL (ref <4)

## 2015-10-02 NOTE — Progress Notes (Signed)
10/02/2015 Jermanie Carnal [id number] 1990/07/12   HISTORY OF PRESENT ILLNESS:  This is a pleasant 25 year old male who is new to our practice and was referred here by his PCP, Dr. Burnice Logan, for evaluation regarding some GI symptoms.  He tells me that all the symptoms started back on May 4th. He complains of intermittent abdominal pain mostly left upper quadrant but can move around in be diffuse as well. He says that sometimes it feels like pins and needles. Pain seems to be worse after eating so he has changed his diet, avoiding fatty/greasy foods and that has seemed to help some.  He also says that his stools have been somewhat loose around this time as well, but not having multiple bowel movements a day and denies nocturnal stooling. He says the stools tend to be yellowish in color, but he denies rectal bleeding or black stools. He denies any associated nausea, vomiting, fever, chills. Admits to what he feels is heartburn/indigestion at times.  He's tells me that prior to this he was not really having any symptoms, but a recent note from Dr. Nonie Hoyer says that he has been complaining of similar symptoms for the past 6 months and thinks that he may have some irritable bowel as the patient relates worsening symptoms with stress.  Denies any foreign travel or recent antibiotic use.  Denies NSAID use.  Has tried pepcid and Rolaids for his symptoms without much relief.     Past Medical History  Diagnosis Date  . Asthma   . History of chicken pox   . Allergy    History reviewed. No pertinent past surgical history.  reports that he has never smoked. He has never used smokeless tobacco. He reports that he drinks about 7.2 oz of alcohol per week. He reports that he does not use illicit drugs. Family history is unknown by patient. No Known Allergies    Outpatient Encounter Prescriptions as of 09/29/2015  Medication Sig  . [DISCONTINUED] Multiple Vitamin (MULTIVITAMIN) tablet Take 1 tablet  by mouth daily.  Marland Kitchen dicyclomine (BENTYL) 10 MG capsule Take 1 capsule (10 mg total) by mouth 2 (two) times daily.  . pantoprazole (PROTONIX) 40 MG tablet Take 1 tablet (40 mg total) by mouth daily.   No facility-administered encounter medications on file as of 09/29/2015.     REVIEW OF SYSTEMS  : All other systems reviewed and negative except where noted in the History of Present Illness.   PHYSICAL EXAM: BP 126/72 mmHg  Pulse 81  Ht 5\' 11"  (1.803 m)  Wt 160 lb (72.576 kg)  BMI 22.33 kg/m2 General: Well developed white male in no acute distress Head: Normocephalic and atraumatic Eyes:  Sclerae anicteric, conjunctiva pink. Ears: Normal auditory acuity Lungs: Clear throughout to auscultation Heart: Regular rate and rhythm Abdomen: Soft, non-distended.  Normal bowel sounds.  Non-tender. Musculoskeletal: Symmetrical with no gross deformities  Skin: No lesions on visible extremities Extremities: No edema  Neurological: Alert oriented x 4, grossly non-focal Psychological:  Alert and cooperative. Normal mood and affect  ASSESSMENT AND PLAN: *25 year old male with complaints of intermittent abdominal pain, can be generalized but mostly left upper quadrant for the past few weeks.  Also has experienced some loose stools as well.  ? Duration of 5-6 weeks vs several months duration.  There are no worrisome symptoms at this time and complaints have only been present for a few weeks.  Will check labs including CBC, CMP, CRP, sed rate, TSH, and celiac labs.  Otherwise will treat symptomatically for possible IBS, gastritis with dicyclomine 10 mg BID and pantoprazole 40 mg daily for now.  I have asked him to call our office in 3-4 weeks with an update of his symptoms.  CC:  Marletta Lor, MD

## 2015-10-03 NOTE — Progress Notes (Signed)
Reviewed and agree with documentation and assessment and plan. K. Veena Labrea Eccleston , MD   

## 2015-10-18 ENCOUNTER — Telehealth: Payer: Self-pay | Admitting: Gastroenterology

## 2015-10-18 NOTE — Telephone Encounter (Signed)
Spoke with patient and he states the Pantoprazole is helping some. States the pain and discomfort is in one spot now(at belly button)  He is not taking Dicyclomine because is made him sleepy and more tired. Stools are clay colored.

## 2015-10-18 NOTE — Telephone Encounter (Signed)
We can offer him an endoscopy to evaluate for ulcer, reflux, gastritis with his ongoing symptoms.  With Dr. Silverio Decamp  Thank you,   Keane Scrape

## 2015-10-18 NOTE — Telephone Encounter (Signed)
Patient given recommendation. Scheduled EGD on 10/26/15 at 9:00 AM and pre visit on 10/23/15 at 8:30 AM.

## 2015-10-23 ENCOUNTER — Ambulatory Visit (AMBULATORY_SURGERY_CENTER): Payer: Self-pay | Admitting: *Deleted

## 2015-10-23 VITALS — Ht 71.0 in | Wt 163.8 lb

## 2015-10-23 DIAGNOSIS — R1013 Epigastric pain: Secondary | ICD-10-CM

## 2015-10-23 NOTE — Progress Notes (Signed)
Pt states he has a very sensitive gag reflex  Denies difficulty moving neck  No egg or soy allergies  Denies diet medications taken  No home oxygen used

## 2015-10-26 ENCOUNTER — Ambulatory Visit (AMBULATORY_SURGERY_CENTER): Payer: 59 | Admitting: Gastroenterology

## 2015-10-26 ENCOUNTER — Encounter: Payer: Self-pay | Admitting: Gastroenterology

## 2015-10-26 VITALS — BP 114/79 | HR 69 | Temp 98.9°F | Resp 13 | Ht 71.0 in | Wt 163.0 lb

## 2015-10-26 DIAGNOSIS — K319 Disease of stomach and duodenum, unspecified: Secondary | ICD-10-CM

## 2015-10-26 DIAGNOSIS — R1013 Epigastric pain: Secondary | ICD-10-CM

## 2015-10-26 MED ORDER — SODIUM CHLORIDE 0.9 % IV SOLN: 500.0000 mL | INTRAVENOUS | Status: DC

## 2015-10-26 NOTE — Patient Instructions (Signed)
YOU HAD AN ENDOSCOPIC PROCEDURE TODAY AT THE [facility]:   Refer to the procedure report that was given to you for any specific questions about what was found during the examination.  If the procedure report does not answer your questions, please call your gastroenterologist to clarify.  If you requested that your care partner not be given the details of your procedure findings, then the procedure report has been included in a sealed envelope for you to review at your convenience later.  YOU SHOULD EXPECT: Some feelings of bloating in the abdomen. Passage of more gas than usual.  Walking can help get rid of the air that was put into your GI tract during the procedure and reduce the bloating. If you had a lower endoscopy (such as a colonoscopy or flexible sigmoidoscopy) you may notice spotting of blood in your stool or on the toilet paper. If you underwent a bowel prep for your procedure, you may not have a normal bowel movement for a few days.  Please Note:  You might notice some irritation and congestion in your nose or some drainage.  This is from the oxygen used during your procedure.  There is no need for concern and it should clear up in a day or so.  SYMPTOMS TO REPORT IMMEDIATELY:   Following lower endoscopy (colonoscopy or flexible sigmoidoscopy):  Excessive amounts of blood in the stool  Significant tenderness or worsening of abdominal pains  Swelling of the abdomen that is new, acute  Fever of 100F or higher   Following upper endoscopy (EGD)  Vomiting of blood or coffee ground material  New chest pain or pain under the shoulder blades  Painful or persistently difficult swallowing  New shortness of breath  Fever of 100F or higher  Black, tarry-looking stools  For urgent or emergent issues, a gastroenterologist can be reached at any hour by calling [phone].   DIET: Your first meal following the procedure should be a small meal and then it is ok to progress to  your normal diet. Heavy or fried foods are harder to digest and may make you feel nauseous or bloated.  Likewise, meals heavy in dairy and vegetables can increase bloating.  Drink plenty of fluids but you should avoid alcoholic beverages for 24 hours.  ACTIVITY:  You should plan to take it easy for the rest of today and you should NOT DRIVE or use heavy machinery until tomorrow (because of the sedation medicines used during the test).    FOLLOW UP: Our staff will call the number listed on your records the next business day following your procedure to check on you and address any questions or concerns that you may have regarding the information given to you following your procedure. If we do not reach you, we will leave a message.  However, if you are feeling well and you are not experiencing any problems, there is no need to return our call.  We will assume that you have returned to your regular daily activities without incident.  If any biopsies were taken you will be contacted by phone or by letter within the next 1-3 weeks.  Please call us at [phone] if you have not heard about the biopsies in 3 weeks.    SIGNATURES/CONFIDENTIALITY: You and/or your care partner have signed paperwork which will be entered into your electronic medical record.  These signatures attest to the fact that that the information above on your After Visit Summary has been reviewed   and is understood.  Full responsibility of the confidentiality of this discharge information lies with you and/or your care-partner.YOU HAD AN ENDOSCOPIC PROCEDURE TODAY AT [facility]:   Refer to the procedure report that was given to you for any specific questions about what was found during the examination.  If the procedure report does not answer your questions, please call your gastroenterologist to clarify.  If you requested that your care partner not be given the details of your procedure findings, then the procedure  report has been included in a sealed envelope for you to review at your convenience later.  YOU SHOULD EXPECT: Some feelings of bloating in the abdomen. Passage of more gas than usual.  Walking can help get rid of the air that was put into your GI tract during the procedure and reduce the bloating. If you had a lower endoscopy (such as a colonoscopy or flexible sigmoidoscopy) you may notice spotting of blood in your stool or on the toilet paper. If you underwent a bowel prep for your procedure, you may not have a normal bowel movement for a few days.  Please Note:  You might notice some irritation and congestion in your nose or some drainage.  This is from the oxygen used during your procedure.  There is no need for concern and it should clear up in a day or so.  SYMPTOMS TO REPORT IMMEDIATELY:   Following lower endoscopy (colonoscopy or flexible sigmoidoscopy):  Excessive amounts of blood in the stool  Significant tenderness or worsening of abdominal pains  Swelling of the abdomen that is new, acute  Fever of 100F or higher   Following upper endoscopy (EGD)  Vomiting of blood or coffee ground material  New chest pain or pain under the shoulder blades  Painful or persistently difficult swallowing  New shortness of breath  Fever of 100F or higher  Black, tarry-looking stools  For urgent or emergent issues, a gastroenterologist can be reached at any hour by calling [phone].   DIET: Your first meal following the procedure should be a small meal and then it is ok to progress to your normal diet. Heavy or fried foods are harder to digest and may make you feel nauseous or bloated.  Likewise, meals heavy in dairy and vegetables can increase bloating.  Drink plenty of fluids but you should avoid alcoholic beverages for 24 hours.  ACTIVITY:  You should plan to take it easy for the rest of today and you should NOT DRIVE or use heavy machinery until tomorrow (because of the sedation  medicines used during the test).    FOLLOW UP: Our staff will call the number listed on your records the next business day following your procedure to check on you and address any questions or concerns that you may have regarding the information given to you following your procedure. If we do not reach you, we will leave a message.  However, if you are feeling well and you are not experiencing any problems, there is no need to return our call.  We will assume that you have returned to your regular daily activities without incident.  If any biopsies were taken you will be contacted by phone or by letter within the next 1-3 weeks.  Please call us at [phone] if you have not heard about the biopsies in 3 weeks.    SIGNATURES/CONFIDENTIALITY: You and/or your care partner have signed paperwork which will be entered into your electronic medical record.  These signatures attest to the fact that that the information above on your After Visit Summary has been reviewed and is understood.  Full responsibility of the confidentiality of this discharge information lies with you and/or your care-partner.YOU HAD AN ENDOSCOPIC PROCEDURE TODAY AT [facility]:   Refer to the procedure report that was given to you for any specific questions about what was found during the examination.  If the procedure report does not answer your questions, please call your gastroenterologist to clarify.  If you requested that your care partner not be given the details of your procedure findings, then the procedure report has been included in a sealed envelope for you to review at your convenience later.  YOU SHOULD EXPECT: Some feelings of bloating in the abdomen. Passage of more gas than usual.  Walking can help get rid of the air that was put into your GI tract during the procedure and reduce the bloating. If you had a lower endoscopy (such as a colonoscopy or flexible sigmoidoscopy) you may notice spotting of blood  in your stool or on the toilet paper. If you underwent a bowel prep for your procedure, you may not have a normal bowel movement for a few days.  Please Note:  You might notice some irritation and congestion in your nose or some drainage.  This is from the oxygen used during your procedure.  There is no need for concern and it should clear up in a day or so.  SYMPTOMS TO REPORT IMMEDIATELY:    Following upper endoscopy (EGD)  Vomiting of blood or coffee ground material  New chest pain or pain under the shoulder blades  Painful or persistently difficult swallowing  New shortness of breath  Fever of 100F or higher  Black, tarry-looking stools  For urgent or emergent issues, a gastroenterologist can be reached at any hour by calling [phone].   DIET: Your first meal following the procedure should be a small meal and then it is ok to progress to your normal diet. Heavy or fried foods are harder to digest and may make you feel nauseous or bloated.  Likewise, meals heavy in dairy and vegetables can increase bloating.  Drink plenty of fluids but you should avoid alcoholic beverages for 24 hours.  ACTIVITY:  You should plan to take it easy for the rest of today and you should NOT DRIVE or use heavy machinery until tomorrow (because of the sedation medicines used during the test).    FOLLOW UP: Our staff will call the number listed on your records the next business day following your procedure to check on you and address any questions or concerns that you may have regarding the information given to you following your procedure. If we do not reach you, we will leave a message.  However, if you are feeling well and you are not experiencing any problems, there is no need to return our call.  We will assume that you have returned to your regular daily activities without incident.  If any biopsies were taken you will be contacted by phone or by letter within the next 1-3 weeks.  Please call us at  [phone] if you have not heard about the biopsies in 3 weeks.    SIGNATURES/CONFIDENTIALITY: You and/or your care partner have signed paperwork which will be entered into your electronic medical record.  These signatures attest to the fact that that the information above on your After Visit Summary has been reviewed and  is understood.  Full responsibility of the confidentiality of this discharge information lies with you and/or your care-partner.  No NSAIDS nor aspirin due to the gastritis.  Thank you for choosing [place] for your healthcare needs today.

## 2015-10-26 NOTE — Progress Notes (Signed)
Report to PACU, RN, vss, BBS= Clear.  

## 2015-10-26 NOTE — Op Note (Signed)
[facility] Patient Name: Edward Salazar Procedure Date: 10/26/2015 9:12 AM MRN: [id number] Endoscopist: Mauri Pole , MD Age: 25 Referring MD:  Date of Birth: 1991-04-02 Gender: Male Account #: [id number] Procedure:                Upper GI endoscopy Indications:              Upper abdominal symptoms that persist despite an                            appropriate trial of therapy Medicines:                Monitored Anesthesia Care Procedure:                Pre-Anesthesia Assessment:                           - Prior to the procedure, a History and Physical                            was performed, and patient medications and                            allergies were reviewed. The patient's tolerance of                            previous anesthesia was also reviewed. The risks                            and benefits of the procedure and the sedation                            options and risks were discussed with the patient.                            All questions were answered, and informed consent                            was obtained. Prior Anticoagulants: The patient has                            taken no previous anticoagulant or antiplatelet                            agents. ASA Grade Assessment: II - A patient with                            mild systemic disease. After reviewing the risks                            and benefits, the patient was deemed in                            satisfactory condition to undergo the procedure.  After obtaining informed consent, the endoscope was                            passed under direct vision. Throughout the                            procedure, the patient's blood pressure, pulse, and                            oxygen saturations were monitored continuously. The                            Model GIF-[id number]) scope was introduced                            through the mouth,  and advanced to the second part                            of duodenum. The upper GI endoscopy was                            accomplished without difficulty. The patient                            tolerated the procedure well. Scope In: Scope Out: Findings:                 The esophagus was normal.                           Patchy mildly erythematous mucosa without bleeding                            was found in the entire examined stomach. Biopsies                            were taken with a cold forceps for Helicobacter                            pylori testing using CLOtest.                           The examined duodenum was normal. Complications:            No immediate complications. Estimated Blood Loss:     Estimated blood loss was minimal. Impression:               - Normal esophagus.                           - Erythematous mucosa in the stomach. Biopsied.                           - Normal examined duodenum. Recommendation:           - Patient has a contact number available for  emergencies. The signs and symptoms of potential                            delayed complications were discussed with the                            patient. Return to normal activities tomorrow.                            Written discharge instructions were provided to the                            patient.                           - Resume previous diet.                           - Continue present medications.                           - Await pathology results.                           - No aspirin, ibuprofen, naproxen, or other                            non-steroidal anti-inflammatory drugs.                           - Abdominal ultrasound to evaluate for possible                            gallstone and gallbladder disease Mauri Pole, MD 10/26/2015 9:30:14 AM This report has been signed electronically.

## 2015-10-26 NOTE — Progress Notes (Signed)
Called to room to assist during endoscopic procedure.  Patient ID and intended procedure confirmed with present staff. Received instructions for my participation in the procedure from the performing physician.  

## 2015-10-27 ENCOUNTER — Telehealth: Payer: Self-pay | Admitting: *Deleted

## 2015-10-27 LAB — HELICOBACTER PYLORI SCREEN-BIOPSY: UREASE: NEGATIVE

## 2015-10-27 NOTE — Telephone Encounter (Signed)
No answer, left message to call if questions or concerns. 

## 2015-10-30 ENCOUNTER — Other Ambulatory Visit: Payer: Self-pay

## 2015-10-30 ENCOUNTER — Telehealth: Payer: Self-pay | Admitting: Gastroenterology

## 2015-10-30 ENCOUNTER — Telehealth: Payer: Self-pay | Admitting: Internal Medicine

## 2015-10-30 DIAGNOSIS — R1013 Epigastric pain: Secondary | ICD-10-CM

## 2015-10-30 NOTE — Telephone Encounter (Signed)
Pt instructed to get vaccinated for the measles. Please advise on how to schedule.  Pt had titer 4/19 and needs for school.

## 2015-10-30 NOTE — Telephone Encounter (Signed)
Friday 01/04/16 arrive 9:15 am NPO after midnight to [facility]. Discussed in detail with the patient.

## 2015-10-31 NOTE — Telephone Encounter (Signed)
Pt has been sch on donna injection for tomorrow

## 2015-10-31 NOTE — Telephone Encounter (Signed)
Patient needs to come in for an MMR injection. Please place on Edward Salazar's injection schedule.

## 2015-11-01 ENCOUNTER — Ambulatory Visit (INDEPENDENT_AMBULATORY_CARE_PROVIDER_SITE_OTHER): Payer: 59 | Admitting: *Deleted

## 2015-11-01 DIAGNOSIS — Z299 Encounter for prophylactic measures, unspecified: Secondary | ICD-10-CM

## 2015-11-01 DIAGNOSIS — Z418 Encounter for other procedures for purposes other than remedying health state: Secondary | ICD-10-CM | POA: Diagnosis not present

## 2015-11-01 DIAGNOSIS — Z23 Encounter for immunization: Secondary | ICD-10-CM | POA: Diagnosis not present

## 2015-11-03 ENCOUNTER — Ambulatory Visit (HOSPITAL_COMMUNITY)
Admission: RE | Admit: 2015-11-03 | Discharge: 2015-11-03 | Disposition: A | Discharge status: Home / Self Care | Payer: 59 | Source: Ambulatory Visit | Attending: Gastroenterology | Admitting: Gastroenterology

## 2015-11-03 ENCOUNTER — Telehealth: Payer: Self-pay | Admitting: Gastroenterology

## 2015-11-03 DIAGNOSIS — R1013 Epigastric pain: Secondary | ICD-10-CM | POA: Diagnosis present

## 2015-11-03 NOTE — Telephone Encounter (Signed)
Left message for patient to call back  

## 2015-11-06 ENCOUNTER — Telehealth: Payer: Self-pay | Admitting: Gastroenterology

## 2015-11-06 NOTE — Telephone Encounter (Signed)
Discussed Pantoprazole. He stopped it a couple of days ago because he developed joint pain. He is better now, but he is taking TUMS twice a day. He will try PPI again. If he has joint pain again, he will stop it and call us. He does feel he was able to eat more normally while on the medication.

## 2015-11-15 ENCOUNTER — Telehealth: Payer: Self-pay | Admitting: Gastroenterology

## 2015-11-15 NOTE — Telephone Encounter (Signed)
scussed the meaning of his abdominal u/s results.

## 2015-11-17 ENCOUNTER — Encounter: Payer: Self-pay | Admitting: Internal Medicine

## 2015-11-17 ENCOUNTER — Ambulatory Visit (INDEPENDENT_AMBULATORY_CARE_PROVIDER_SITE_OTHER): Payer: 59 | Admitting: Internal Medicine

## 2015-11-17 VITALS — BP 130/78 | HR 74 | Temp 97.5°F | Wt 158.8 lb

## 2015-11-17 DIAGNOSIS — R1084 Generalized abdominal pain: Secondary | ICD-10-CM

## 2015-11-17 DIAGNOSIS — F4323 Adjustment disorder with mixed anxiety and depressed mood: Secondary | ICD-10-CM

## 2015-11-17 MED ORDER — ESCITALOPRAM OXALATE 10 MG PO TABS: 10.0000 mg | ORAL_TABLET | Freq: Every day | ORAL | 1 refills | Status: AC

## 2015-11-17 NOTE — Progress Notes (Signed)
   Subjective:    Patient ID: Edward Salazar, male    DOB: 06/04/1990, 25 y.o.   MRN: [id number]  HPI  25 year old patient who is seen today for follow-up.  He states that he has become much more anxious and depressed.  He continues to have abdominal pain and has been evaluated by GI which has included upper endoscopy and abdominal ultrasound. He states that his anxiety began in 27-Jul-2013 with the death of his father.  There are several present stressors.  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 He states that at times he feels mildly depressed, but his chief complaint is anxiety.  This has intensified over the past year  Past Medical History:  Diagnosis Date  . Allergy   . Asthma   . History of chicken pox      Social History   Social History  . Marital status: Single    Spouse name: N/A  . Number of children: N/A  . Years of education: N/A   Occupational History  . Mechanic    Social History Main Topics  . Smoking status: Never Smoker  . Smokeless tobacco: Never Used  . Alcohol use 7.2 oz/week    12 Cans of beer per week     Comment: recently decreased 1 beer a week  . Drug use: No  . Sexual activity: Not on file   Other Topics Concern  . Not on file   Social History Narrative  . No narrative on file    Past Surgical History:  Procedure Laterality Date  . TONSILLECTOMY      Family History  Problem Relation Age of Onset  . Adopted: Yes  . Family history unknown: Yes    No Known Allergies  Current Outpatient Prescriptions on File Prior to Visit  Medication Sig Dispense Refill  . pantoprazole (PROTONIX) 40 MG tablet Take 1 tablet (40 mg total) by mouth daily. 30 tablet 3   No current facility-administered medications on file prior to visit.     BP 130/78 (BP Location: Left Arm, Patient Position: Sitting, Cuff Size: Normal)   Pulse 74   Temp 97.5 F (36.4 C) (Oral)   Wt 158 lb 12.8 oz (72 kg)   SpO2 98%   BMI 22.15  kg/m     Review of Systems  Constitutional: Positive for fatigue.  Psychiatric/Behavioral: Positive for dysphoric mood. Negative for hallucinations, sleep disturbance and suicidal ideas. The patient is nervous/anxious.        Objective:   Physical Exam  Constitutional: He appears well-developed and well-nourished. No distress.  Psychiatric: He has a normal mood and affect. His behavior is normal. Judgment and thought content normal.  Anxious No distress Good eye contact Articulates well          Assessment & Plan:   Adjustment disorder with mixed anxiety and depressed mood.  Will place on Lexapro 10.  The patient will be leaving the area in the next day or 2.  He has been encouraged to follow-up with behavioral health at [facility] state and received some counseling.  Since he is leaving the area.  He will return here as needed only.  He was encouraged to follow-up with a local PCP  Nyoka Cowden, MD

## 2015-11-17 NOTE — Patient Instructions (Addendum)
Please check with the [facility] at, [place] for counseling  Call or return to clinic prn if these symptoms worsen or fail to improve as anticipated.

## 2015-11-17 NOTE — Progress Notes (Signed)
Pre visit review using our clinic review tool, if applicable. No additional management support is needed unless otherwise documented below in the visit note. 

## 2015-12-12 ENCOUNTER — Telehealth: Payer: Self-pay | Admitting: Gastroenterology

## 2015-12-12 NOTE — Telephone Encounter (Signed)
Left a message for the patient.

## 2018-02-12 IMAGING — US US ABDOMEN COMPLETE
1 series · 14 of 25 positions shown · non-contrast
Comparison: No prior.

CLINICAL DATA: Abdominal pain.

EXAM:
ABDOMEN ULTRASOUND COMPLETE

[Series 1: us abdomen complete · 0.19mm/px · 14 of 111 slices shown]
[im 1/111]
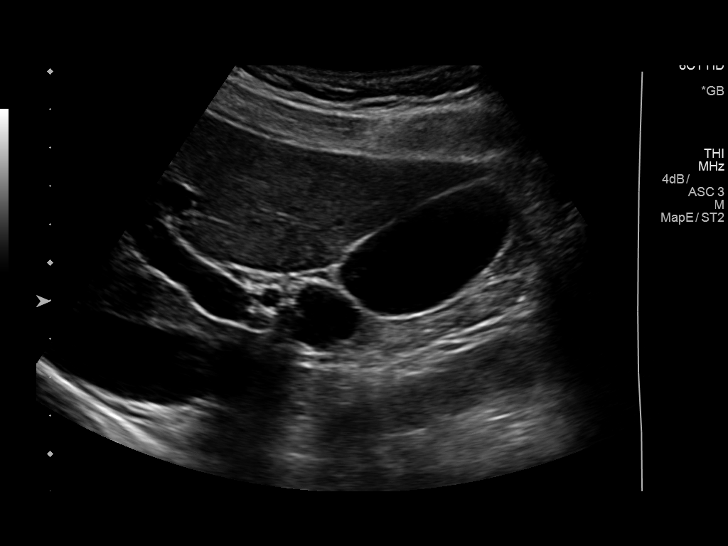
[im 10/111]
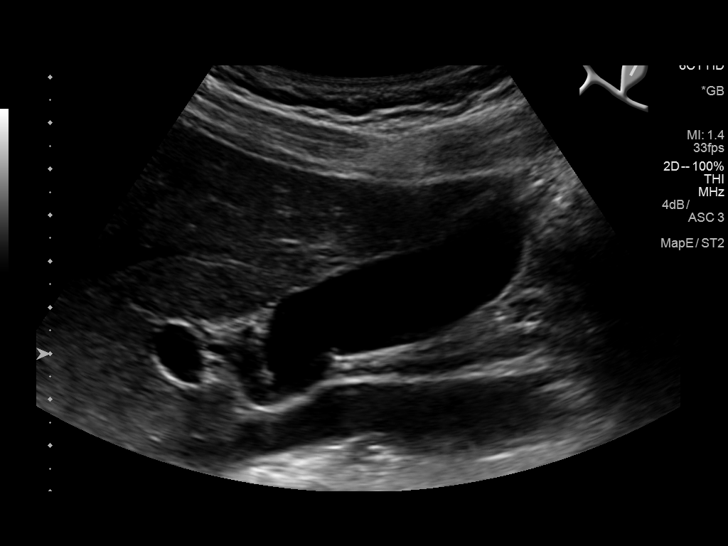
[im 19/111]
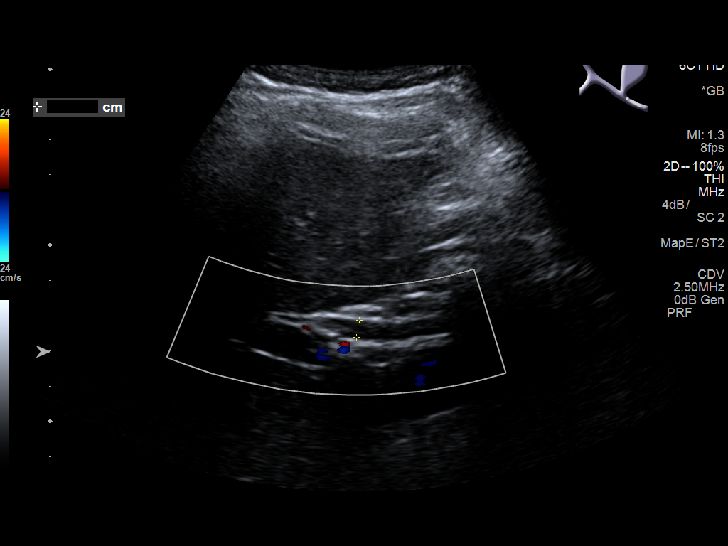
[im 28/111]
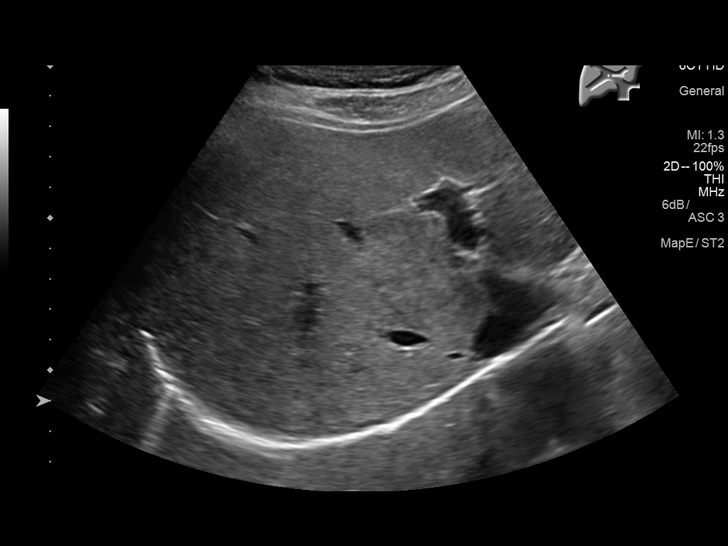
[im 37/111]
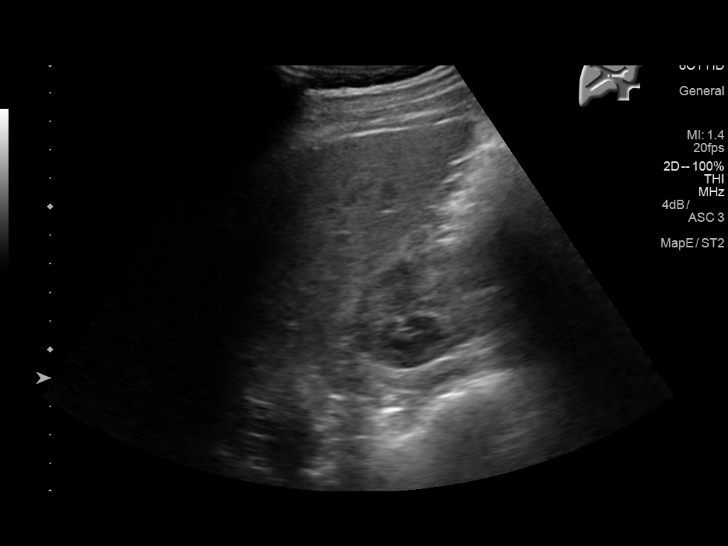
[im 42/111]
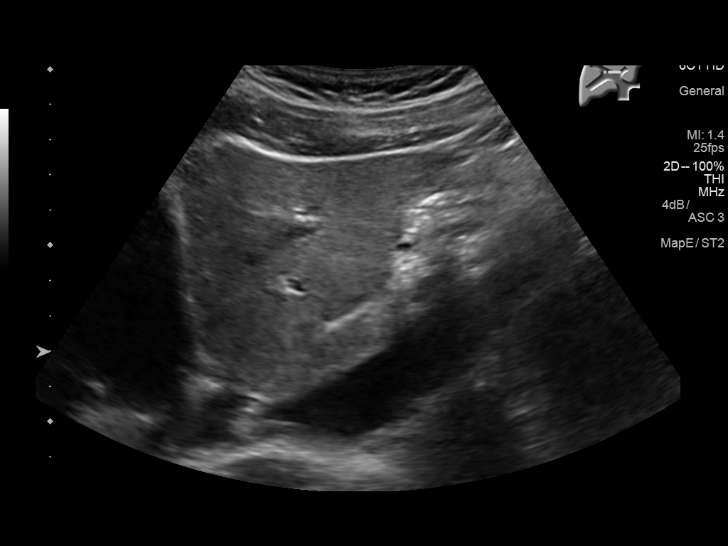
[im 51/111]
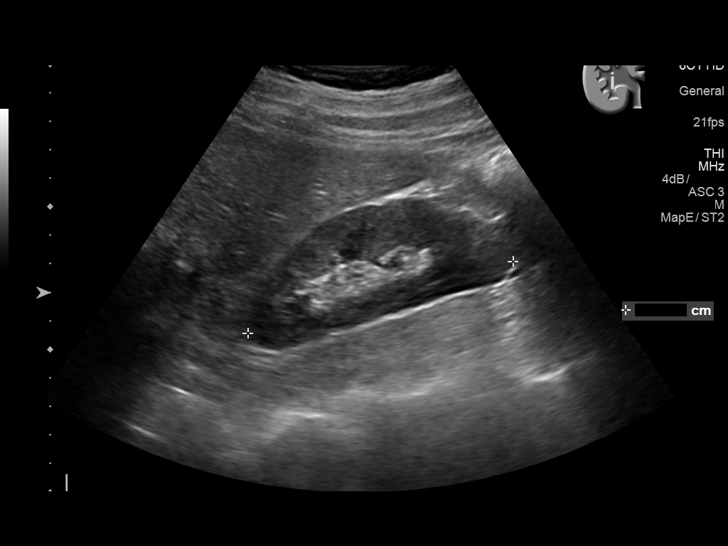
[im 60/111]
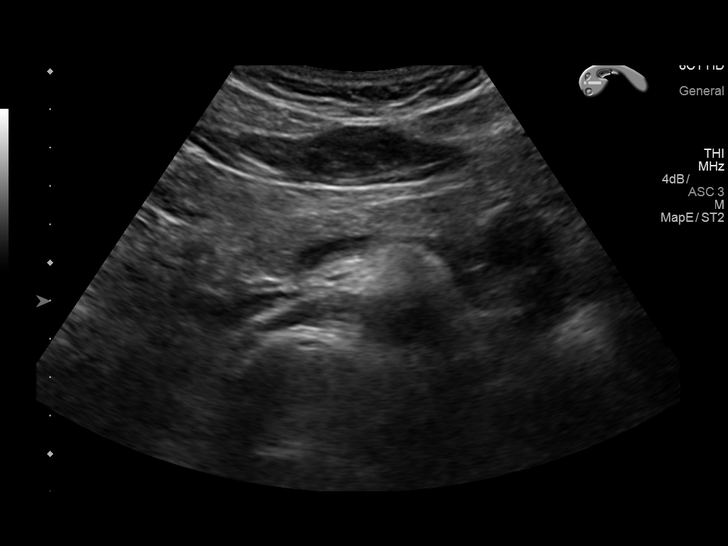
[im 69/111]
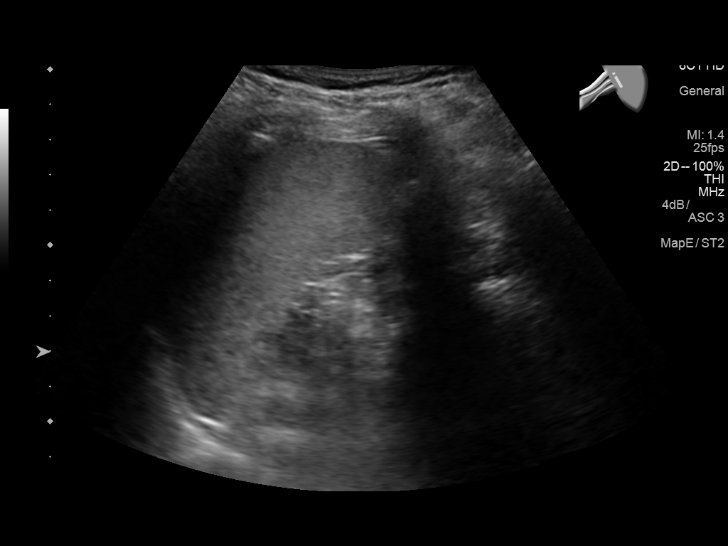
[im 74/111]
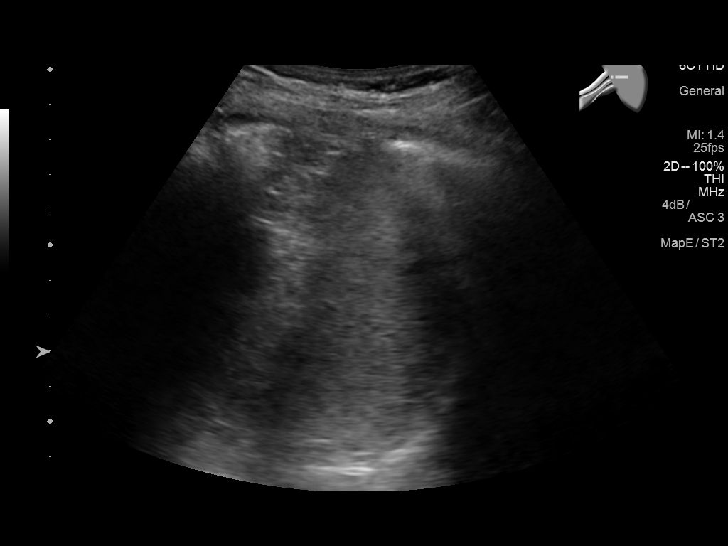
[im 83/111]
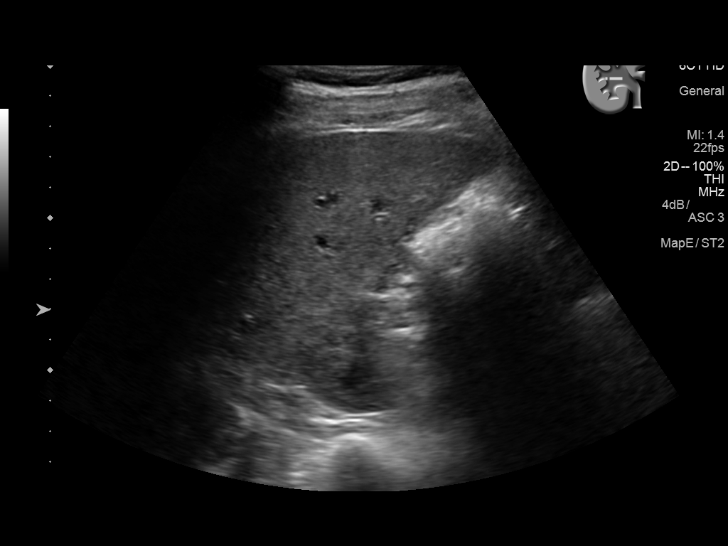
[im 92/111]
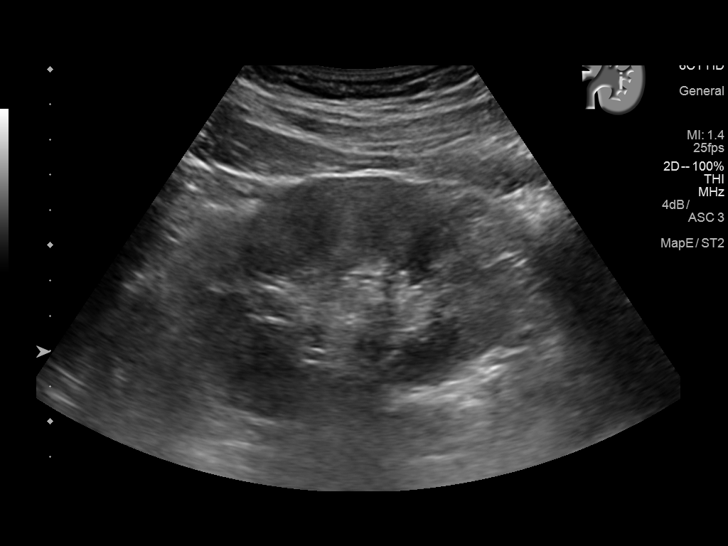
[im 101/111]
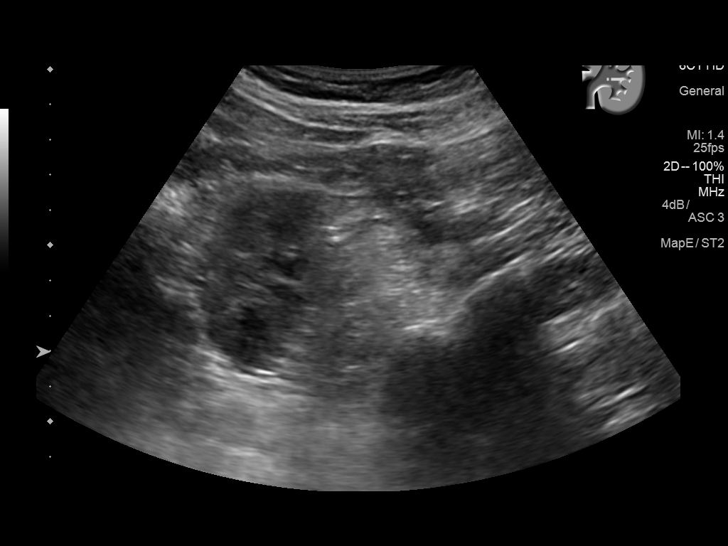
[im 111/111]
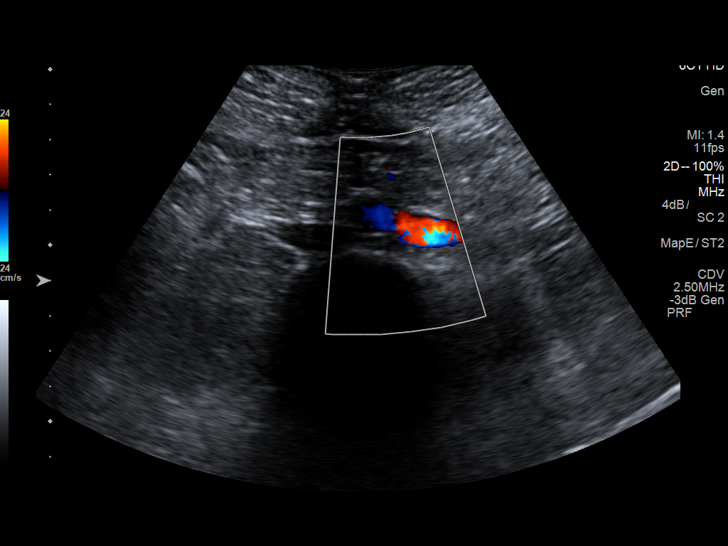

[14 of 25 positions shown; findings below may reference images not displayed]

FINDINGS: Gallbladder: No gallstones or wall thickening visualized. No
sonographic Murphy sign noted by sonographer.

Common bile duct: Diameter: 4.9 mm

Liver: No focal lesion identified. Within normal limits in
parenchymal echogenicity.

IVC: No abnormality visualized.

Pancreas: Visualized portion unremarkable.

Spleen: Size and appearance within normal limits.

Right Kidney: Length: 10.2 cm. Echogenicity within normal limits. No
mass or hydronephrosis visualized.

Left Kidney: Length: 10.4 cm. Echogenicity within normal limits. No
mass or hydronephrosis visualized.

Abdominal aorta: No aneurysm visualized.

Other findings: None.
IMPRESSION: Negative exam.
